# Patient Record
Sex: Male | Born: 1970 | Race: White | Hispanic: No | Marital: Single
Health system: Southern US, Community
[De-identification: ages and names within clinical notes are randomized; demographics above are authoritative.]

## PROBLEM LIST (undated history)

## (undated) HISTORY — PX: JOINT REPLACEMENT: SHX530

---

## 1998-10-15 ENCOUNTER — Emergency Department (HOSPITAL_COMMUNITY): Admission: EM | Admit: 1998-10-15 | Discharge: 1998-10-15 | Payer: Self-pay | Admitting: Emergency Medicine

## 1999-11-28 ENCOUNTER — Emergency Department (HOSPITAL_COMMUNITY): Admission: EM | Admit: 1999-11-28 | Discharge: 1999-11-28 | Payer: Self-pay

## 2002-02-14 ENCOUNTER — Emergency Department (HOSPITAL_COMMUNITY): Admission: EM | Admit: 2002-02-14 | Discharge: 2002-02-14 | Payer: Self-pay | Admitting: Emergency Medicine

## 2002-02-14 ENCOUNTER — Encounter: Payer: Self-pay | Admitting: Emergency Medicine

## 2002-10-09 ENCOUNTER — Emergency Department (HOSPITAL_COMMUNITY): Admission: EM | Admit: 2002-10-09 | Discharge: 2002-10-09 | Payer: Self-pay | Admitting: Emergency Medicine

## 2003-02-10 ENCOUNTER — Ambulatory Visit (HOSPITAL_BASED_OUTPATIENT_CLINIC_OR_DEPARTMENT_OTHER): Admission: RE | Admit: 2003-02-10 | Discharge: 2003-02-10 | Payer: Self-pay | Admitting: General Surgery

## 2006-10-07 ENCOUNTER — Emergency Department (HOSPITAL_COMMUNITY): Admission: EM | Admit: 2006-10-07 | Discharge: 2006-10-07 | Payer: Self-pay | Admitting: Emergency Medicine

## 2018-01-14 ENCOUNTER — Encounter (HOSPITAL_COMMUNITY): Payer: Self-pay | Admitting: *Deleted

## 2018-01-14 ENCOUNTER — Emergency Department (HOSPITAL_COMMUNITY): Payer: Self-pay

## 2018-01-14 ENCOUNTER — Emergency Department (HOSPITAL_COMMUNITY)
Admission: EM | Admit: 2018-01-14 | Discharge: 2018-01-14 | Disposition: A | Discharge status: Home / Self Care | Payer: Self-pay | Attending: Emergency Medicine | Admitting: Emergency Medicine

## 2018-01-14 DIAGNOSIS — S0990XA Unspecified injury of head, initial encounter: Secondary | ICD-10-CM

## 2018-01-14 DIAGNOSIS — Y999 Unspecified external cause status: Secondary | ICD-10-CM | POA: Insufficient documentation

## 2018-01-14 DIAGNOSIS — Z23 Encounter for immunization: Secondary | ICD-10-CM | POA: Insufficient documentation

## 2018-01-14 DIAGNOSIS — Y929 Unspecified place or not applicable: Secondary | ICD-10-CM | POA: Insufficient documentation

## 2018-01-14 DIAGNOSIS — S0101XA Laceration without foreign body of scalp, initial encounter: Secondary | ICD-10-CM | POA: Insufficient documentation

## 2018-01-14 DIAGNOSIS — Y939 Activity, unspecified: Secondary | ICD-10-CM | POA: Insufficient documentation

## 2018-01-14 MED ORDER — TETANUS-DIPHTH-ACELL PERTUSSIS 5-2.5-18.5 LF-MCG/0.5 IM SUSP
0.5000 mL | Freq: Once | INTRAMUSCULAR | Status: AC
  Administered 2018-01-14: 0.5 mL via INTRAMUSCULAR
  Filled 2018-01-14: qty 0.5

## 2018-01-14 MED ORDER — LIDOCAINE-EPINEPHRINE (PF) 2 %-1:200000 IJ SOLN
10.0000 mL | Freq: Once | INTRAMUSCULAR | Status: AC
  Administered 2018-01-14: 10 mL via INTRADERMAL
  Filled 2018-01-14: qty 20

## 2018-01-14 NOTE — ED Notes (Signed)
Patient given discharge instructions and verbalized understanding.  Patient stable to discharge at this time.  Patient is alert and oriented to baseline.  No distressed noted at this time.  All belongings taken with the patient at discharge.   
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
## 2018-01-14 NOTE — Discharge Instructions (Signed)
Apply ice pack several times a day. Ibuprofen or tylenol for headache. Bacitracin twice a day for laceration. Follow up for staple removal in 10 days. Return if worsening headache.
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
